# Patient Record
Sex: Male | Born: 1980 | Race: White | Hispanic: No | Marital: Single
Health system: Southern US, Community
[De-identification: ages and names within clinical notes are randomized; demographics above are authoritative.]

---
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
## 2000-08-03 ENCOUNTER — Emergency Department (HOSPITAL_COMMUNITY): Admission: EM | Admit: 2000-08-03 | Discharge: 2000-08-03 | Payer: Self-pay | Admitting: Emergency Medicine

## 2000-08-03 ENCOUNTER — Encounter: Payer: Self-pay | Admitting: Emergency Medicine

## 2002-04-30 ENCOUNTER — Encounter (HOSPITAL_BASED_OUTPATIENT_CLINIC_OR_DEPARTMENT_OTHER): Payer: Self-pay | Admitting: General Surgery

## 2002-05-02 ENCOUNTER — Ambulatory Visit (HOSPITAL_COMMUNITY): Admission: RE | Admit: 2002-05-02 | Discharge: 2002-05-02 | Payer: Self-pay | Admitting: General Surgery

## 2002-05-02 ENCOUNTER — Encounter (INDEPENDENT_AMBULATORY_CARE_PROVIDER_SITE_OTHER): Payer: Self-pay | Admitting: *Deleted

## 2003-09-27 ENCOUNTER — Emergency Department (HOSPITAL_COMMUNITY): Admission: EM | Admit: 2003-09-27 | Discharge: 2003-09-27 | Payer: Self-pay | Admitting: Emergency Medicine

## 2004-03-20 ENCOUNTER — Emergency Department (HOSPITAL_COMMUNITY): Admission: EM | Admit: 2004-03-20 | Discharge: 2004-03-20 | Payer: Self-pay | Admitting: Emergency Medicine

## 2004-03-24 ENCOUNTER — Emergency Department (HOSPITAL_COMMUNITY): Admission: EM | Admit: 2004-03-24 | Discharge: 2004-03-24 | Payer: Self-pay | Admitting: Emergency Medicine

## 2009-10-19 ENCOUNTER — Emergency Department (HOSPITAL_COMMUNITY): Admission: EM | Admit: 2009-10-19 | Discharge: 2009-10-19 | Payer: Self-pay | Admitting: Emergency Medicine

## 2009-10-20 ENCOUNTER — Ambulatory Visit (HOSPITAL_COMMUNITY): Admission: RE | Admit: 2009-10-20 | Discharge: 2009-10-20 | Payer: Self-pay | Admitting: Urology

## 2011-01-03 LAB — DIFFERENTIAL
Basophils Absolute: 0 K/uL (ref 0.0–0.1)
Basophils Relative: 0 % (ref 0–1)
Eosinophils Absolute: 0 K/uL (ref 0.0–0.7)
Eosinophils Relative: 0 % (ref 0–5)
Lymphocytes Relative: 7 % — ABNORMAL LOW (ref 12–46)
Lymphs Abs: 0.7 K/uL (ref 0.7–4.0)
Monocytes Absolute: 0.2 K/uL (ref 0.1–1.0)
Monocytes Relative: 2 % — ABNORMAL LOW (ref 3–12)
Neutro Abs: 10.3 K/uL — ABNORMAL HIGH (ref 1.7–7.7)
Neutrophils Relative %: 91 % — ABNORMAL HIGH (ref 43–77)

## 2011-01-03 LAB — POCT I-STAT, CHEM 8
BUN: 13 mg/dL (ref 6–23)
Calcium, Ion: 1.18 mmol/L (ref 1.12–1.32)
HCT: 46 % (ref 39.0–52.0)
TCO2: 23 mmol/L (ref 0–100)

## 2011-01-03 LAB — CBC
HCT: 45.9 % (ref 39.0–52.0)
Hemoglobin: 15.9 g/dL (ref 13.0–17.0)
MCHC: 34.7 g/dL (ref 30.0–36.0)
MCV: 89.3 fL (ref 78.0–100.0)
Platelets: 317 K/uL (ref 150–400)
RBC: 5.14 MIL/uL (ref 4.22–5.81)
RDW: 12.1 % (ref 11.5–15.5)
WBC: 11.4 K/uL — ABNORMAL HIGH (ref 4.0–10.5)

## 2011-03-05 NOTE — Op Note (Signed)
[place]  Patient:    Leonard Alexander, Leonard Alexander Visit Number: [id number] MRN: [id number]          Service Type: DSU Location: [facility] [id number] Attending Physician:  Sonda Primes Dictated by:   Mardene Celeste Lurene Shadow, M.D. Proc. Date: 05/02/02 Admit Date:  05/02/2002 Discharge Date: 05/02/2002   CC:         [name] [name], M.D. 2 copies   Operative Report  PREOPERATIVE DIAGNOSIS:  Left inguinal hernia.  POSTOPERATIVE DIAGNOSIS:  Left indirect and direct inguinal hernia.  PROCEDURE:  Left inguinal herniorrhaphy with polypropolene mesh.  SURGEON:  Mardene Celeste. Lurene Shadow, M.D.  ASSISTANT:  Nurse.  ANESTHESIA:  General.  INDICATIONS FOR PROCEDURE:  The patient is a 30 year old man presenting with a left groin and scrotal bulge which reduces spontaneously on recumbency.  He is brought to the operating room now after the risks and potential benefits of surgery have been fully discussed with him, and he gives his full consent.  DESCRIPTION OF PROCEDURE:  Following the induction of satisfactory general anesthesia, the patient positioned supinely, the left groin was prepped and draped to be included the sterile operative field.  A transverse incision in the lower abdominal crease was deepened through the skin and subcutaneous tissues down to the external oblique aponeurosis was carried out.  The external oblique aponeurosis was opened up through the external inguinal ring, and the ilioinguinal nerve was retracted medially and cephalad.  The spermatic cord was elevated, and held with a Penrose drain.  Dissection along the anterior medial aspect of the cord showed a large indirect inguinal hernia extending well down into the scrotum.  The hernia was dissected free from surrounding cord contents.  It was transected just before its entrance into the scrotum, and the cephalad portion of the hernia was dissected free and carried up to the internal ring.   The sac was then trimmed of all surrounding tissues, and suture ligated at the base with 2-0 silk sutures.  Redundant sac was amputated and forwarded for pathologic evaluation.  We then incurred a modest hernial defect in the ______ triangle with an onlay patch of polypropolene mesh which was sewn at the pubic tubercle with a 2-0 Novofil, and continued up along the conjoin tendon to the internal ring, and again from the pubic tubercle along the shelving edge of Pouparts ligament up to the internal ring, with the mesh having been split so as to have normal protrusion of the spermatic cord.  The mesh was then trimmed and sutured down to the internal oblique muscle behind the spermatic cord, thus creating a new internal ring.  All areas of dissection were then checked for hemostasis, additional bleeding points treated with electrocautery.  Sponge, instruments, and Sharp counts were verified.  External oblique aponeurosis was closed over the cord with a running suture of 2-0 Vicryl.  Scarpas fascia and subcutaneous tissues closed with a running suture of 3-0 Vicryl, and the skin was closed with a running 4-0 Monocryl suture.  The wound was reinforced with Steri-Strips, sterile dressings were applied.  The anesthetic was reversed. The patient was moved from the operating room to the recovery room in stable condition, having tolerated the procedure well. Dictated by:   Mardene Celeste. Lurene Shadow, M.D. Attending Physician:  Sonda Primes DD:  05/02/02 TD:  05/06/02 Job: [id number]

## 2011-04-10 IMAGING — CT CT PELVIS W/O CM
1 of 2 series · 13 of 32 positions shown, 19 images · non-contrast
Comparison: 09/27/2003

CT ABDOMEN

CLINICAL DATA: Flank pain and low back pain for 2 days.

CT OF THE ABDOMEN AND PELVIS WITHOUT CONTRAST (CT UROGRAM)
TECHNIQUE: Multidetector CT imaging was performed through the
abdomen and pelvis to include the urinary tract.

[Series 2: under 200# stone no prev · axial · 0.72mm/px · z∈[+825,+1215]mm · 13 of 90 slices shown, 19 images]
[im 6/90  soft-tissue]
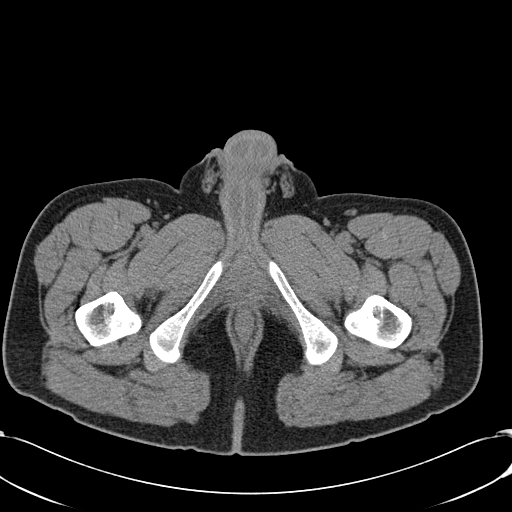
[im 6/90  bone]
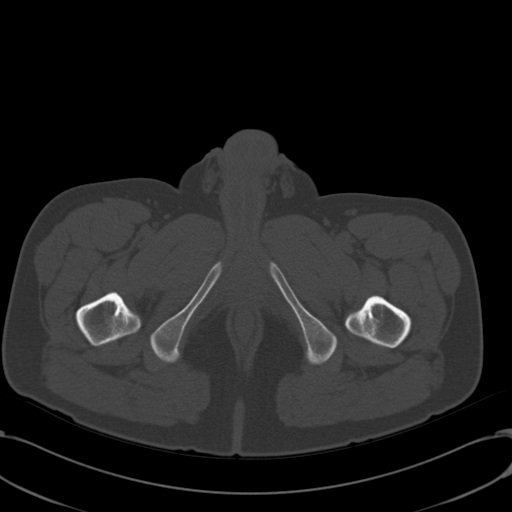
[im 12/90  soft-tissue]
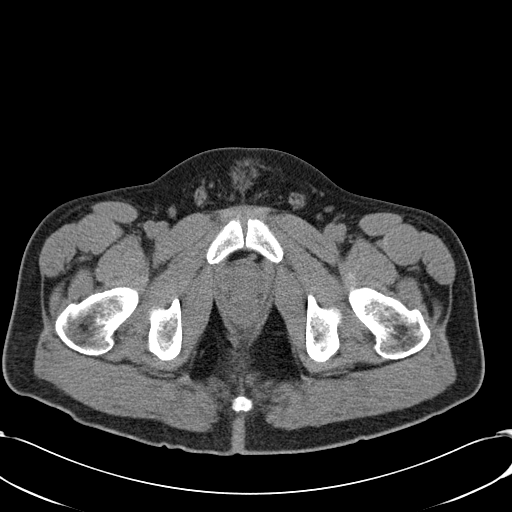
[im 18/90  soft-tissue]
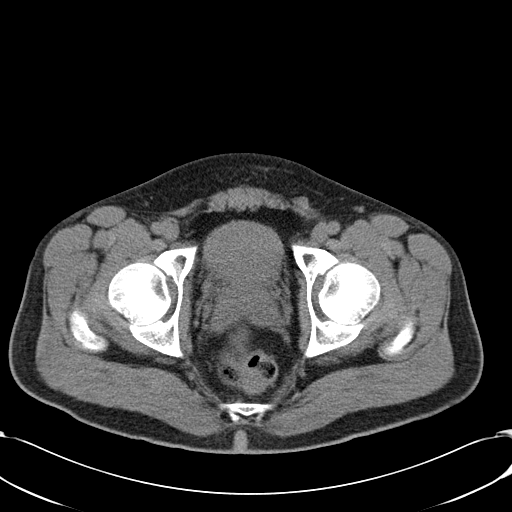
[im 24/90  soft-tissue]
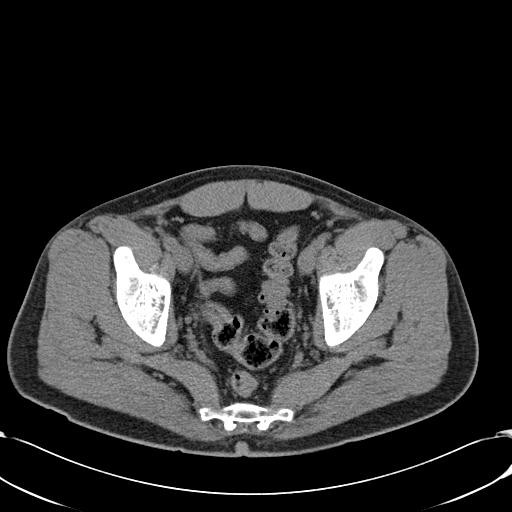
[im 30/90  soft-tissue]
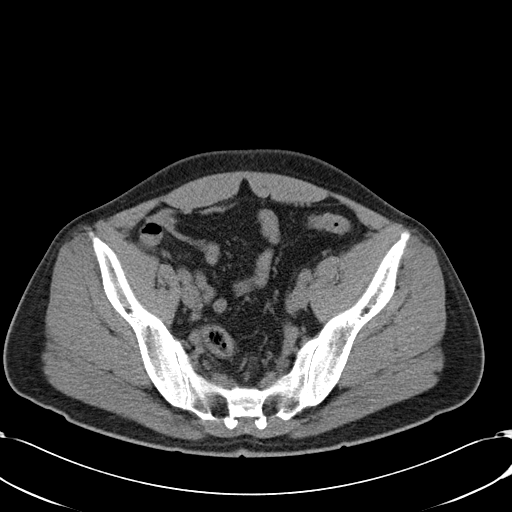
[im 36/90  soft-tissue]
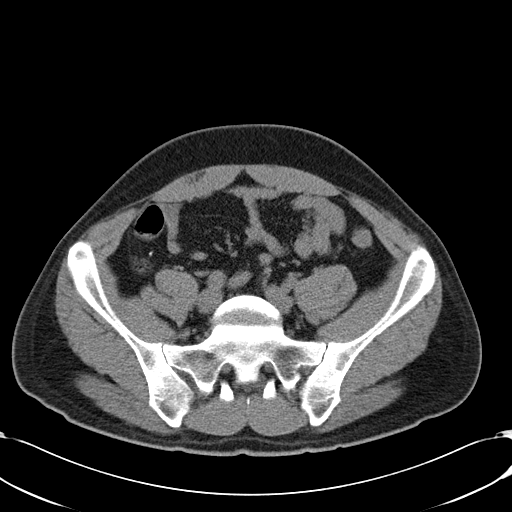
[im 48/90  soft-tissue]
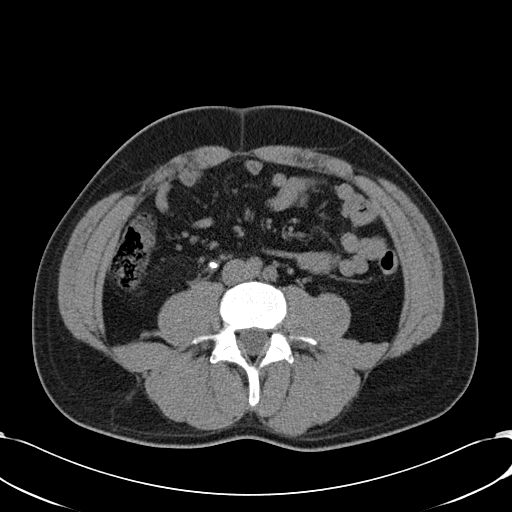
[im 54/90  soft-tissue]
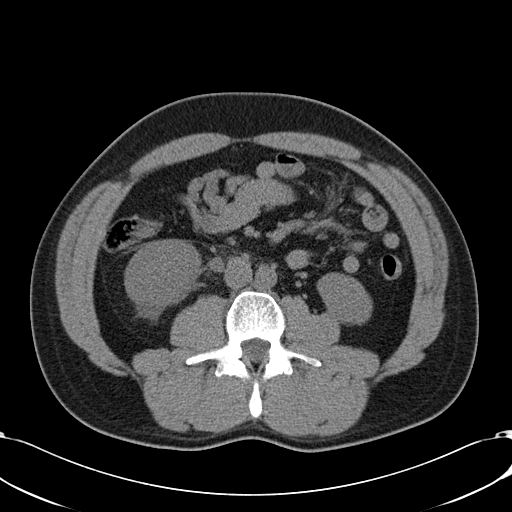
[im 60/90  soft-tissue]
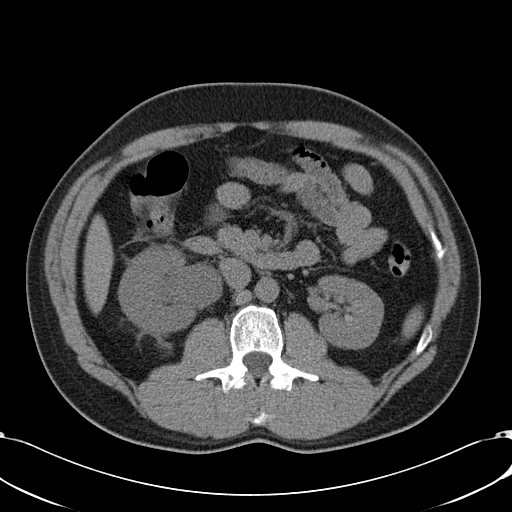
[im 60/90  bone]
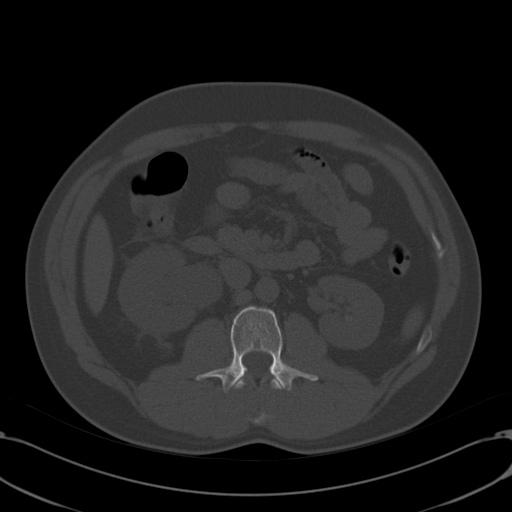
[im 66/90  soft-tissue]
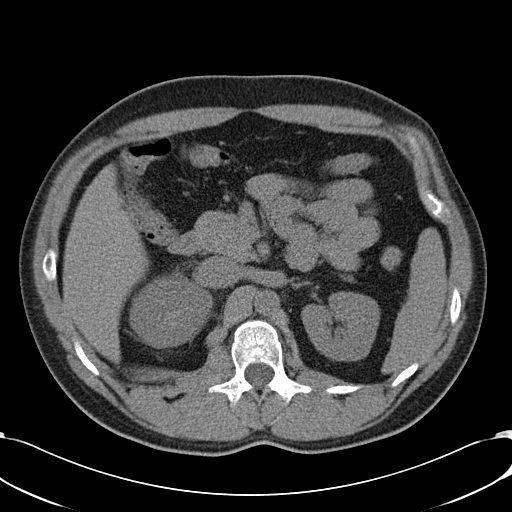
[im 66/90  lung]
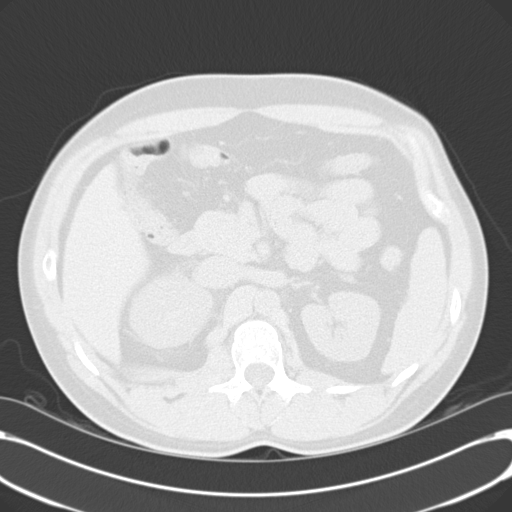
[im 72/90  soft-tissue]
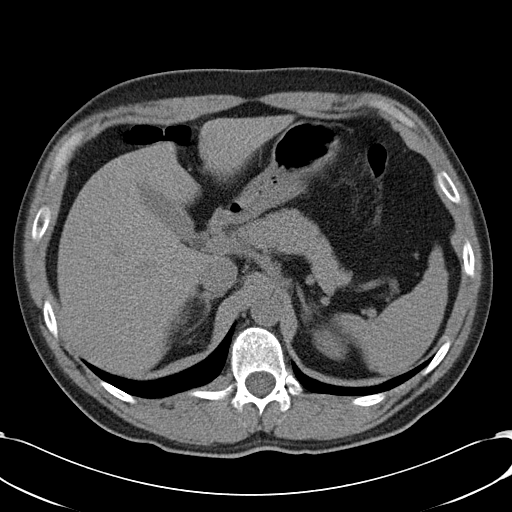
[im 72/90  lung]
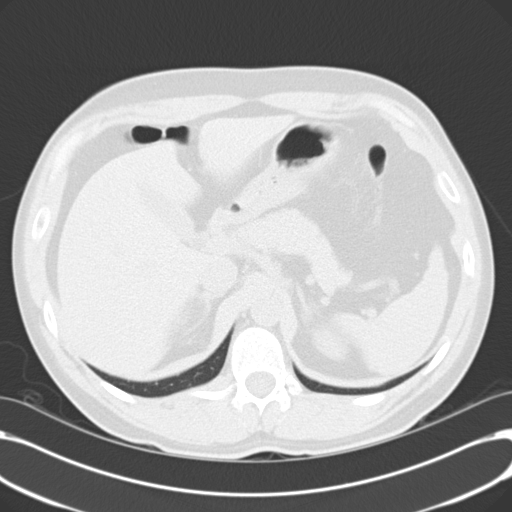
[im 78/90  soft-tissue]
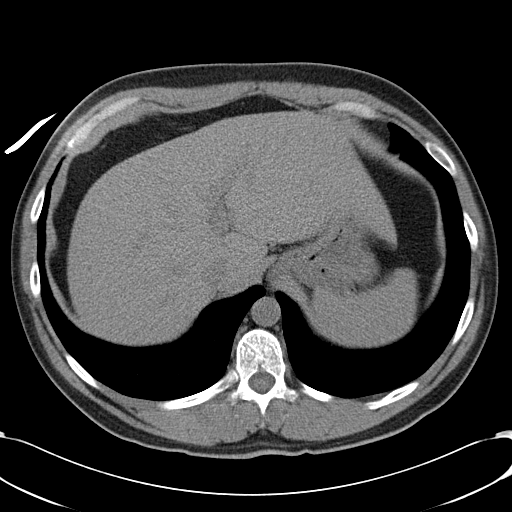
[im 78/90  lung]
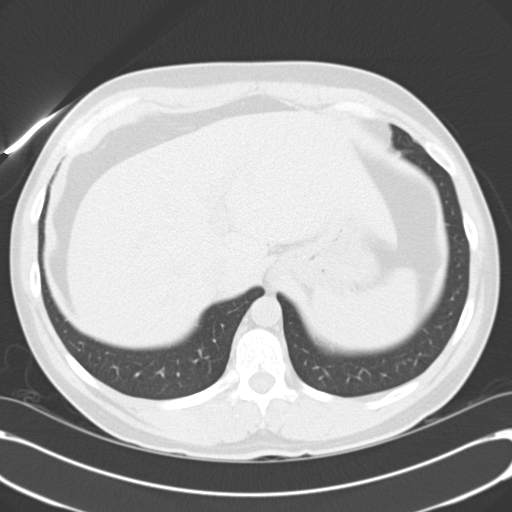
[im 84/90  soft-tissue]
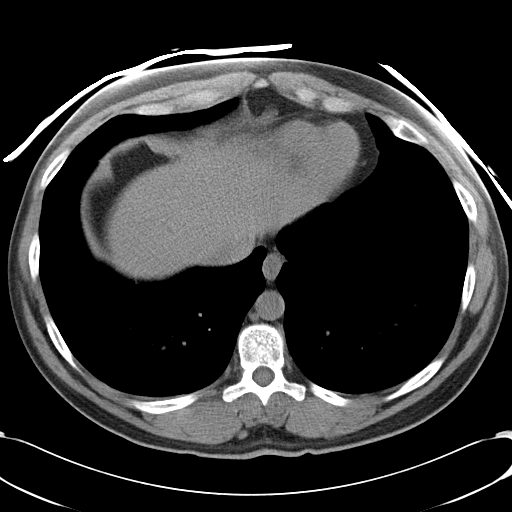
[im 84/90  lung]
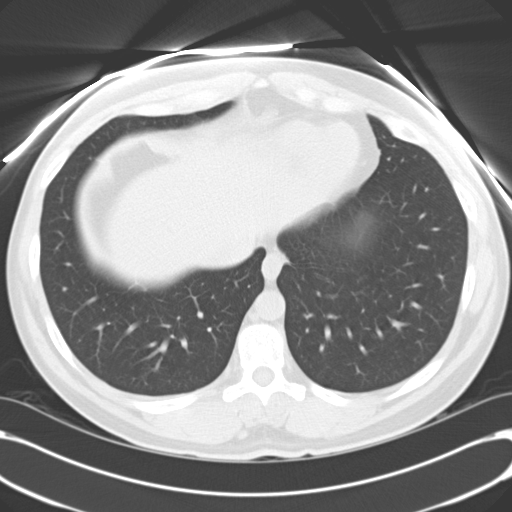

[13 of 32 positions shown; findings below may reference images not displayed]

FINDINGS: There is a 7 mm stone obstructing the right ureter at
the level of the umbilicus causing moderate right hydronephrosis
and right perinephric soft tissue stranding.  There is a 2 mm stone
in the midportion of the right kidney.  There are several tiny
stones in the upper pole of the left kidney.

The liver, spleen, pancreas, adrenal glands, and left kidney are
otherwise normal.  Visualized bowel is normal.

Incidental note is made of what appears be congenital fusion of T11
and T12.  There are also bilateral pars defects at L5 with a grade
1 spondylolisthesis of L5 on S1.
IMPRESSION: 7 mm stone obstructing the right ureter at the L3-4 level.

CT PELVIS
FINDINGS: The distal ureters are normal.  There is a single stable
phlebolith in the left side of the pelvis.  There is no free fluid
or diverticular disease.
IMPRESSION: Benign-appearing pelvis.

## 2014-07-15 ENCOUNTER — Ambulatory Visit (HOSPITAL_BASED_OUTPATIENT_CLINIC_OR_DEPARTMENT_OTHER): Payer: 59 | Attending: Family Medicine

## 2014-07-15 VITALS — Ht 69.0 in | Wt 190.0 lb

## 2014-07-15 DIAGNOSIS — G4733 Obstructive sleep apnea (adult) (pediatric): Secondary | ICD-10-CM | POA: Insufficient documentation

## 2014-07-15 DIAGNOSIS — Z9989 Dependence on other enabling machines and devices: Secondary | ICD-10-CM

## 2014-07-20 DIAGNOSIS — G4733 Obstructive sleep apnea (adult) (pediatric): Secondary | ICD-10-CM

## 2014-07-20 NOTE — Sleep Study (Signed)
   NAME: Leonard Alexander DATE OF BIRTH:  12/02/1980 MEDICAL RECORD NUMBER [id number]  LOCATION: [facility]  PHYSICIAN: Justyn Langham D  DATE OF STUDY: 07/15/2014  SLEEP STUDY TYPE: Nocturnal Polysomnogram               REFERRING PHYSICIAN: Lupe CarneyMitchell, Dean, MD  INDICATION FOR STUDY: Hypersomnia with sleep apnea  EPWORTH SLEEPINESS SCORE:   5/24 HEIGHT: 5\' 9"  (175.3 cm)  WEIGHT: 190 lb (86.183 kg)    Body mass index is 28.05 kg/(m^2).  NECK SIZE: 17 in.  MEDICATIONS: Charted for review  SLEEP ARCHITECTURE: Split study protocol. During the diagnostic phase, total sleep time 128 minutes with sleep efficiency 79%. Stage I was 23.4%, stage II 75.8%, stage III 0.8%, REM absent. Sleep latency 27.5 minutes, awake after sleep onset 7.5 minutes, arousal index 8.0, bedtime medication: None  RESPIRATORY DATA: Apnea hypopneas index (AHI) 25.8 per hour. 55 total events scored including 1 central apnea and 54 hypopneas. Events were seen in all positions, especially while supine. CPAP was titrated to 10 CWP, AHI 4.8 per hour. He wore a medium nasal mask.  OXYGEN DATA: Moderate snoring before CPAP with oxygen desaturation to a nadir of 87% on room air. With CPAP control, snoring was prevented and mean oxygen saturation was 95%.  CARDIAC DATA: Normal sinus rhythm  MOVEMENT/PARASOMNIA: During the diagnostic phase, 143 limb jerks were counted of which 4 were associated with arousal or waking for a periodic limb movement with arousal index of 1.9 per hour. During the titration phase, 91 limb jerks were counted but none were associated with arousals or awakenings. No bathroom trips  IMPRESSION/ RECOMMENDATION:   1) Moderate obstructive sleep apnea/hypopneas syndrome, AHI 25.8 per hour with events in all positions. Moderate snoring with oxygen desaturation to a nadir of 87% on room air. 2) Successful CPAP titration to 10 CWP, AHI 4.8 per hour. He wore a medium [employer] Eson Nasal  mask with heated humidifier, EPR of 2. Snoring was prevented and mean oxygen saturation was 95% on room air.   [name],[name] Diplomate, American Board of Sleep Medicine  ELECTRONICALLY SIGNED ON:  07/20/2014, 11:01 AM  [facility] PH: [phone]   FX: [phone] ACCREDITED BY THE AMERICAN [employer]Y OF SLEEP MEDICINE

## 2019-11-08 ENCOUNTER — Other Ambulatory Visit: Payer: Self-pay | Admitting: Gastroenterology

## 2019-11-08 DIAGNOSIS — R1032 Left lower quadrant pain: Secondary | ICD-10-CM

## 2019-11-15 ENCOUNTER — Ambulatory Visit
Admission: RE | Admit: 2019-11-15 | Discharge: 2019-11-15 | Disposition: A | Discharge status: Home / Self Care | Payer: 59 | Source: Ambulatory Visit | Attending: Gastroenterology | Admitting: Gastroenterology

## 2019-11-15 DIAGNOSIS — R1032 Left lower quadrant pain: Secondary | ICD-10-CM

## 2019-11-15 MED ORDER — IOPAMIDOL (ISOVUE-300) INJECTION 61%
100.0000 mL | Freq: Once | INTRAVENOUS | Status: AC | PRN
  Administered 2019-11-15: 100 mL via INTRAVENOUS

## 2021-05-06 IMAGING — CT CT ABD-PEL WO/W CM
2 of 8 series · 13 of 46 positions shown, 18 images · IV contrast (iopamidol)
Comparison: Plain film 10/20/2009. Stone study 10/19/2009. Report
only.

CLINICAL DATA: Right-sided pain for several weeks. Hematuria
x1/[REDACTED]. History of lithotripsy, appendectomy, hernia repair.

EXAM:
CT ABDOMEN AND PELVIS WITHOUT AND WITH CONTRAST
TECHNIQUE: Multidetector CT imaging of the abdomen and pelvis was performed
following the standard protocol before and following the bolus
administration of intravenous contrast.
CONTRAST:  100mL [id number] IOPAMIDOL ([id number]) INJECTION 61%

[Series 6: abd pelvis pre 2.00 br40 s3 cor · coronal · non-contrast · 0.70mm/px · 3 of 153 slices shown]
[im 39/153  soft-tissue]
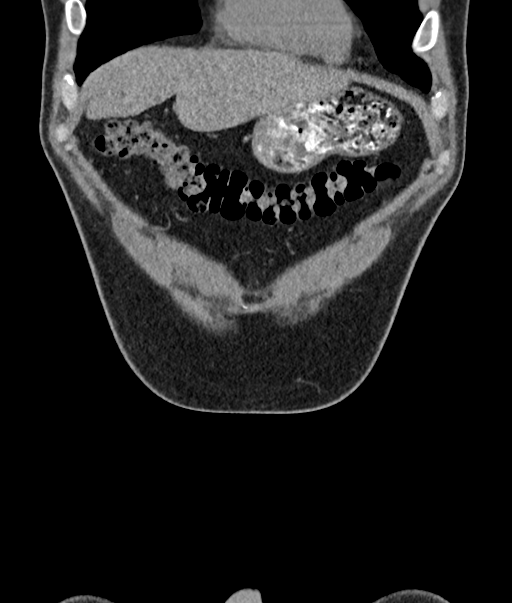
[im 77/153  soft-tissue]
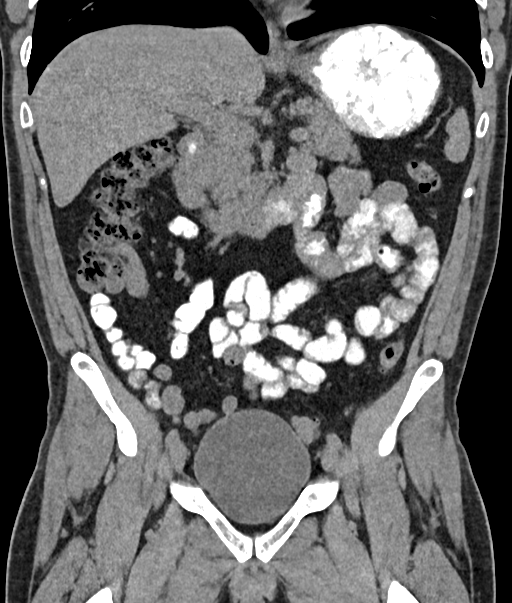
[im 115/153  soft-tissue]
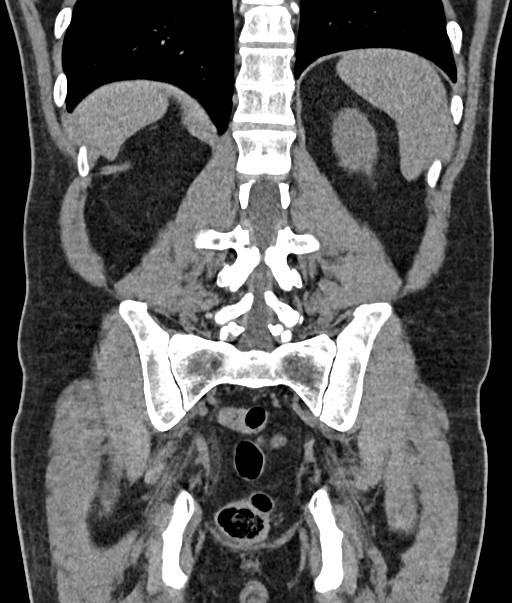

[Series 11: abd pelvis post 5.00 br40 s3 axial · axial · 0.70mm/px · z∈[+1135,+1540]mm · 10 of 97 slices shown, 15 images]
[im 8/97  soft-tissue]
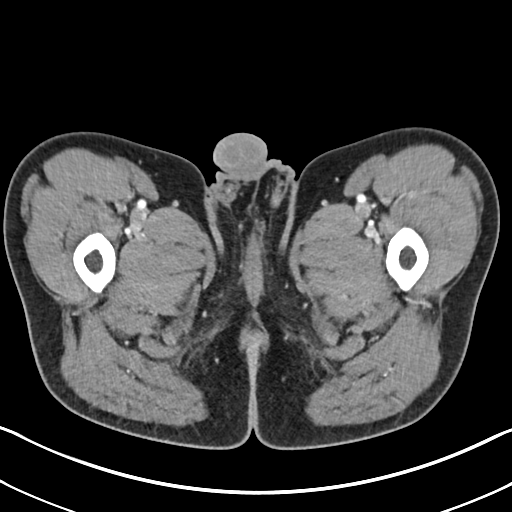
[im 8/97  bone]
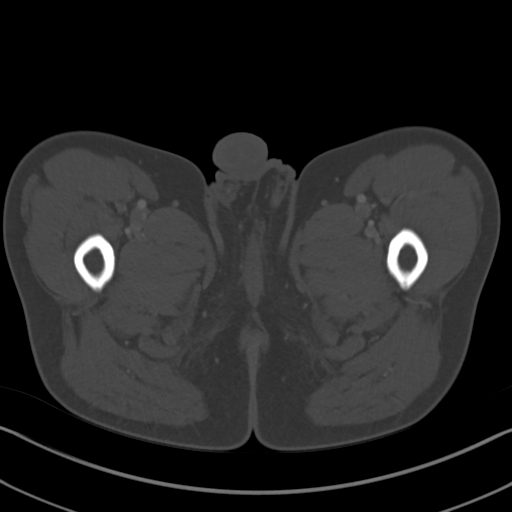
[im 23/97  soft-tissue]
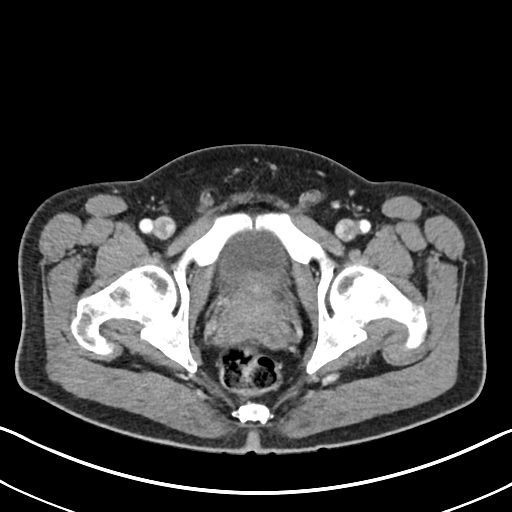
[im 30/97  soft-tissue]
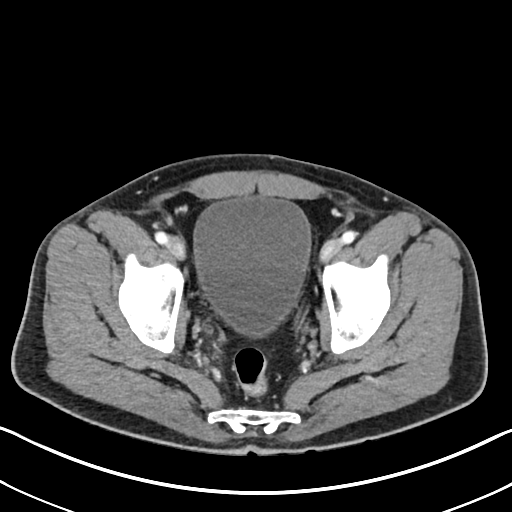
[im 37/97  soft-tissue]
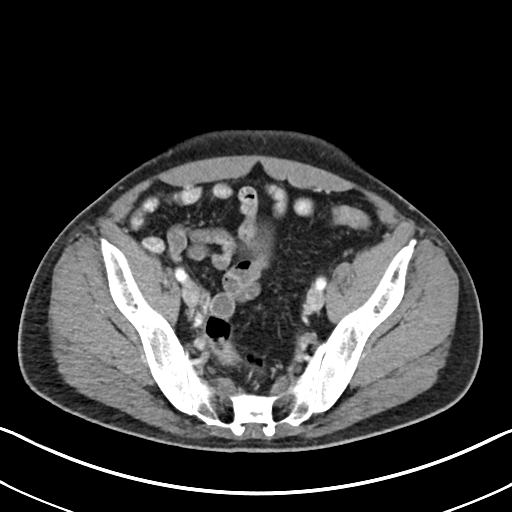
[im 52/97  soft-tissue]
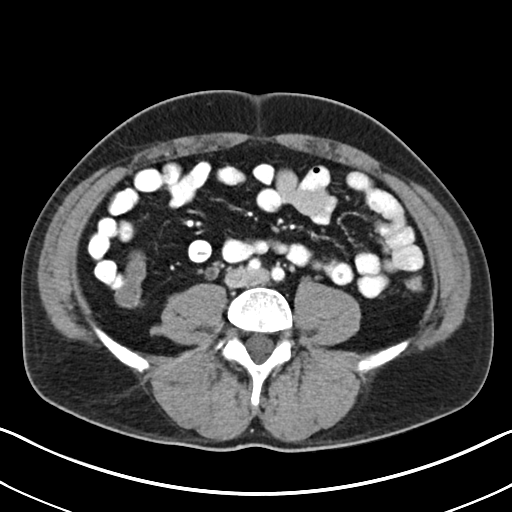
[im 60/97  soft-tissue]
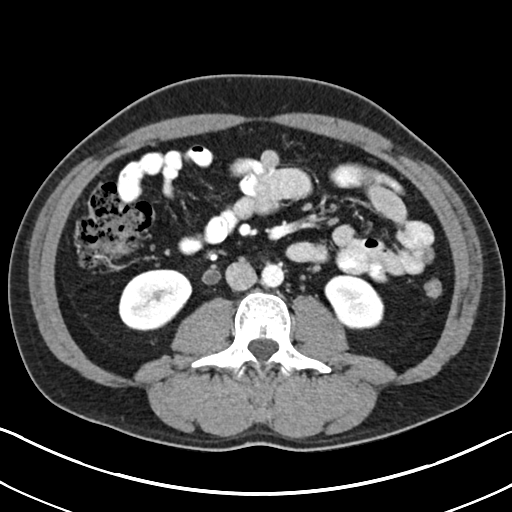
[im 67/97  soft-tissue]
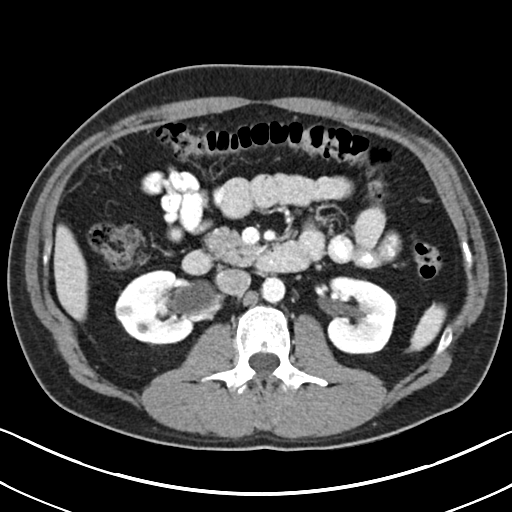
[im 67/97  lung]
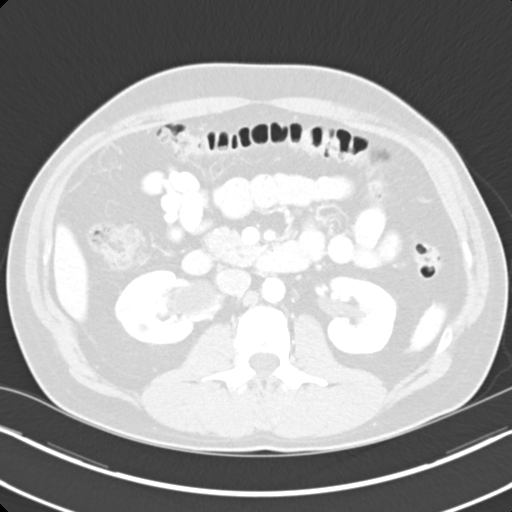
[im 74/97  lung]
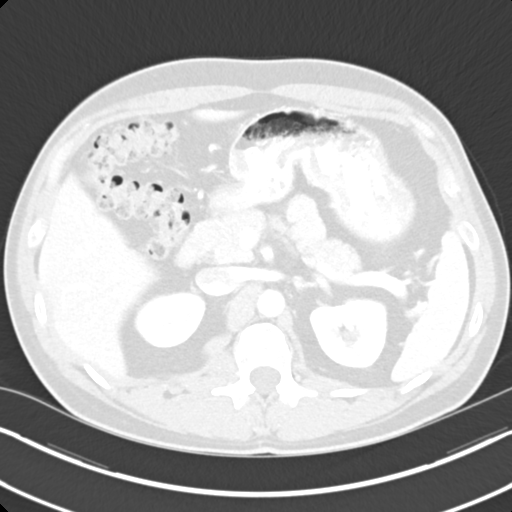
[im 82/97  soft-tissue]
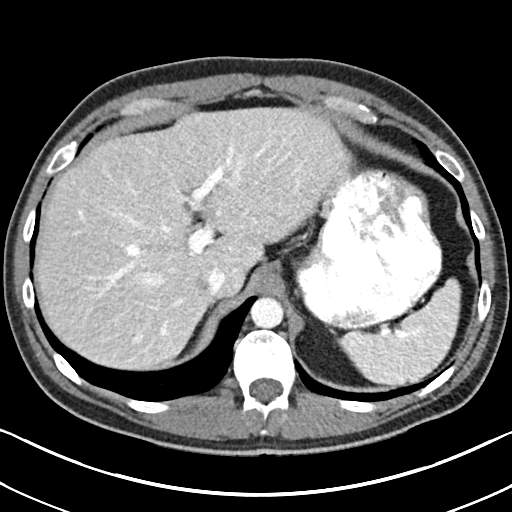
[im 82/97  lung]
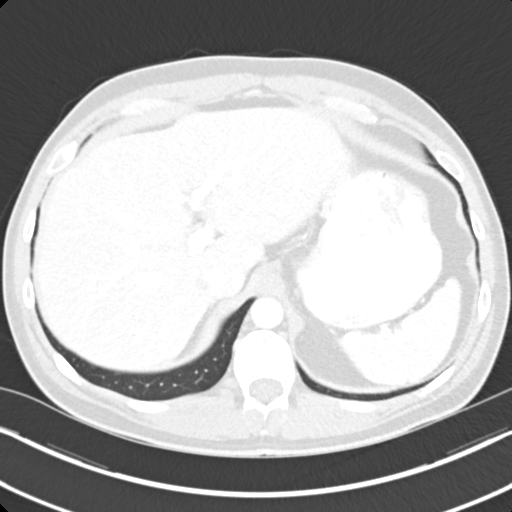
[im 89/97  soft-tissue]
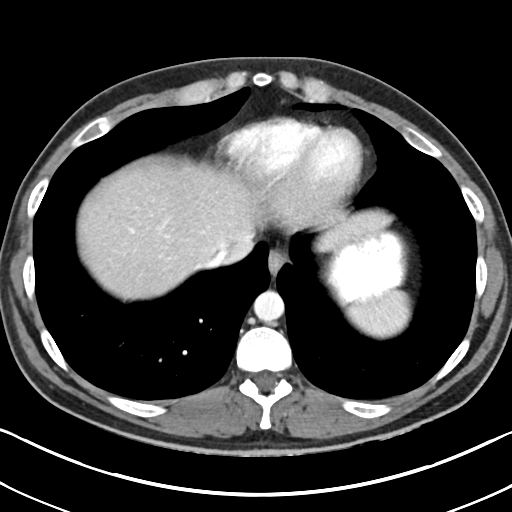
[im 89/97  lung]
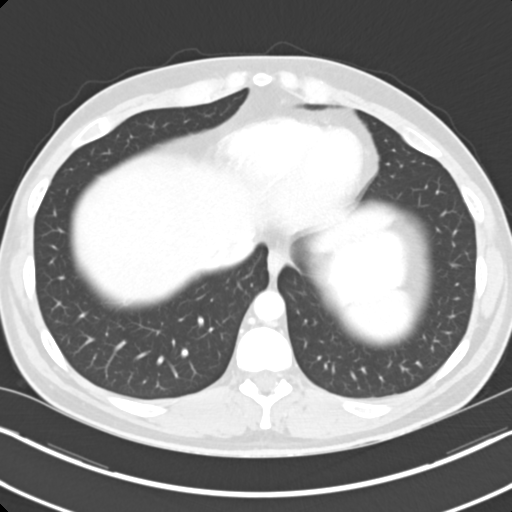
[im 89/97  bone]
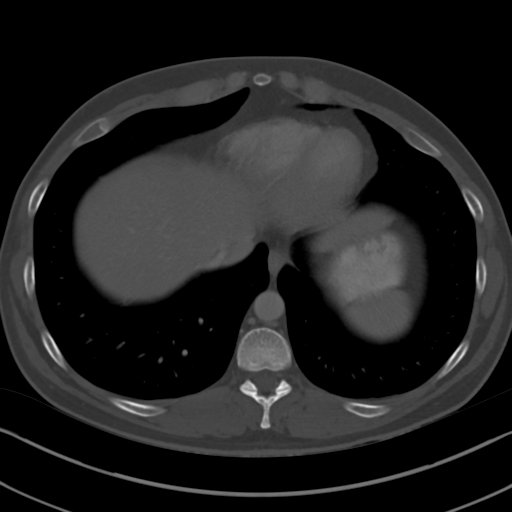

[13 of 46 positions shown; findings below may reference images not displayed]

FINDINGS: Lower chest: Clear lung bases. Normal heart size without pericardial
or pleural effusion.

Hepatobiliary: Normal liver. Normal gallbladder, without biliary
ductal dilatation.

Pancreas: Normal, without mass or ductal dilatation.

Spleen: Normal in size, without focal abnormality.

Adrenals/Urinary Tract: Normal adrenal glands. Bilateral renal
collecting system calculi, maximally 5 mm in the interpolar right
kidney.

Moderate right hydroureteronephrosis with mid ureteric stone of 11
mm on 86/6 coronal and mid to distal right ureteric stone of 10 mm
on 67/6 coronal. No bladder calculi.

Low-density bilateral renal lesions are technically too small to
characterize but likely cysts. No enhancing bladder mass.

Stomach/Bowel: Normal stomach, without wall thickening. Normal colon
and terminal ileum. Normal small bowel.

Vascular/Lymphatic: Normal caliber of the aorta and branch vessels.
No abdominopelvic adenopathy.

Reproductive: Normal prostate.

Other: No significant free fluid.

Musculoskeletal: Bilateral L5 pars defects without malalignment.
Degenerative or developmental partial fusion of the T11-12 level.
IMPRESSION: 1. Moderate right-sided urinary tract obstruction secondary to mid
and distal right ureteric calculi.
2. Bilateral nephrolithiasis.
